# Patient Record
Sex: Male | Born: 1985 | Race: White | Hispanic: No | Marital: Single | Smoking: Former smoker
Health system: Southern US, Community
[De-identification: ages and names within clinical notes are randomized; demographics above are authoritative.]

## PROBLEM LIST (undated history)

## (undated) DIAGNOSIS — K219 Gastro-esophageal reflux disease without esophagitis: Secondary | ICD-10-CM

## (undated) DIAGNOSIS — M1990 Unspecified osteoarthritis, unspecified site: Secondary | ICD-10-CM

## (undated) DIAGNOSIS — F410 Panic disorder [episodic paroxysmal anxiety] without agoraphobia: Secondary | ICD-10-CM

## (undated) DIAGNOSIS — F419 Anxiety disorder, unspecified: Secondary | ICD-10-CM

## (undated) DIAGNOSIS — Z114 Encounter for screening for human immunodeficiency virus [HIV]: Secondary | ICD-10-CM

## (undated) HISTORY — DX: Unspecified osteoarthritis, unspecified site: M19.90

## (undated) HISTORY — DX: Gastro-esophageal reflux disease without esophagitis: K21.9

## (undated) HISTORY — DX: Encounter for screening for human immunodeficiency virus (HIV): Z11.4

## (undated) HISTORY — PX: EYE SURGERY: SHX253

---

## 2006-04-15 ENCOUNTER — Ambulatory Visit: Payer: Self-pay | Admitting: Pulmonary Disease

## 2007-02-24 ENCOUNTER — Ambulatory Visit: Payer: Self-pay | Admitting: Pulmonary Disease

## 2010-08-25 NOTE — Assessment & Plan Note (Signed)
[facility]                             PULMONARY OFFICE NOTE   Malik Wade, Malik Wade                     MRN:          [id number]  DATE:02/24/2007                            DOB:          May 17, 1985    HISTORY OF PRESENT ILLNESS:  The patient is a 25 year old white male  patient of Dr. Jodelle Green who was in his usual good state of health up  until 2 days ago when he started having nasal congestion, postnasal  drip, sore throat, hoarseness, and productive cough with thick mucus.  The patient denies any hemoptysis, orthopnea, PND, recent travel,  antibiotic use, or neck pain.   PAST MEDICAL HISTORY:  Reviewed.   CURRENT MEDICATIONS:  Reviewed.   PHYSICAL EXAMINATION:  GENERAL:  The patient is a pleasant male in no  acute distress.  VITAL SIGNS:  He is afebrile with stable vital signs.  O2 saturation is  99% on room air.  HEENT:  Nasal mucosa has some mild erythema.  Posterior pharynx is  clear.  NECK:  Supple without cervical adenopathy.  No JVD.  LUNGS:  Lung sounds are clear without wheezing or crackles.  CARDIAC:  Regular rate.  ABDOMEN:  Soft and nontender.  EXTREMITIES:  Warm without any edema.   IMPRESSION AND PLAN:  Acute upper respiratory infection suspected to be  viral in nature.  The patient is to begin Mucinex DM twice a day, Zyrtec  10 mg at bedtime as needed, Tylenol p.r.n., nasal hygiene regimen with  saline and Afrin nasal spray.  Discharge instruction sheet given.  The  patient is to return back with Dr. Kriste Basque as needed.  The patient was  given a Z-Pak to have on hold in case symptoms worsened after 1 week.      Rubye Oaks, NP  Electronically Signed      Lonzo Cloud. Kriste Basque, MD  Electronically Signed   TP/MedQ  DD: 02/24/2007  DT: 02/26/2007  Job #: [id number]

## 2011-07-13 ENCOUNTER — Telehealth: Payer: Self-pay | Admitting: Pulmonary Disease

## 2011-07-13 NOTE — Telephone Encounter (Signed)
Spoke with pt. He is c/o sharp pain in belly x 2 days. States that the pain comes and goes, but when occurs, the pain is severe. I advised should seek emergent care. Not seen here in over 3 yrs. Pt verbalized understanding and states nothing further needed.

## 2013-05-30 ENCOUNTER — Emergency Department (HOSPITAL_COMMUNITY)
Admission: EM | Admit: 2013-05-30 | Discharge: 2013-05-30 | Disposition: A | Discharge status: Home / Self Care | Payer: BC Managed Care – PPO | Attending: Emergency Medicine | Admitting: Emergency Medicine

## 2013-05-30 ENCOUNTER — Emergency Department (HOSPITAL_COMMUNITY): Payer: BC Managed Care – PPO

## 2013-05-30 ENCOUNTER — Encounter (HOSPITAL_COMMUNITY): Payer: Self-pay | Admitting: Emergency Medicine

## 2013-05-30 DIAGNOSIS — F411 Generalized anxiety disorder: Secondary | ICD-10-CM | POA: Insufficient documentation

## 2013-05-30 DIAGNOSIS — R0789 Other chest pain: Secondary | ICD-10-CM | POA: Insufficient documentation

## 2013-05-30 DIAGNOSIS — R630 Anorexia: Secondary | ICD-10-CM | POA: Insufficient documentation

## 2013-05-30 DIAGNOSIS — Z79899 Other long term (current) drug therapy: Secondary | ICD-10-CM | POA: Insufficient documentation

## 2013-05-30 DIAGNOSIS — F1410 Cocaine abuse, uncomplicated: Secondary | ICD-10-CM

## 2013-05-30 DIAGNOSIS — R1011 Right upper quadrant pain: Secondary | ICD-10-CM | POA: Insufficient documentation

## 2013-05-30 DIAGNOSIS — F17200 Nicotine dependence, unspecified, uncomplicated: Secondary | ICD-10-CM | POA: Insufficient documentation

## 2013-05-30 DIAGNOSIS — R109 Unspecified abdominal pain: Secondary | ICD-10-CM

## 2013-05-30 DIAGNOSIS — R110 Nausea: Secondary | ICD-10-CM | POA: Insufficient documentation

## 2013-05-30 DIAGNOSIS — F419 Anxiety disorder, unspecified: Secondary | ICD-10-CM

## 2013-05-30 DIAGNOSIS — R079 Chest pain, unspecified: Secondary | ICD-10-CM

## 2013-05-30 HISTORY — DX: Panic disorder (episodic paroxysmal anxiety): F41.0

## 2013-05-30 HISTORY — DX: Anxiety disorder, unspecified: F41.9

## 2013-05-30 LAB — CBC WITH DIFFERENTIAL/PLATELET
BASOS PCT: 0 % (ref 0–1)
Basophils Absolute: 0 10*3/uL (ref 0.0–0.1)
Eosinophils Absolute: 0.1 10*3/uL (ref 0.0–0.7)
Eosinophils Relative: 2 % (ref 0–5)
HEMATOCRIT: 43 % (ref 39.0–52.0)
Hemoglobin: 15.7 g/dL (ref 13.0–17.0)
Lymphocytes Relative: 49 % — ABNORMAL HIGH (ref 12–46)
Lymphs Abs: 3.9 10*3/uL (ref 0.7–4.0)
MCH: 30.9 pg (ref 26.0–34.0)
MCHC: 36.5 g/dL — ABNORMAL HIGH (ref 30.0–36.0)
MCV: 84.6 fL (ref 78.0–100.0)
Monocytes Absolute: 0.7 10*3/uL (ref 0.1–1.0)
Monocytes Relative: 9 % (ref 3–12)
NEUTROS ABS: 3.2 10*3/uL (ref 1.7–7.7)
NEUTROS PCT: 40 % — AB (ref 43–77)
Platelets: 200 10*3/uL (ref 150–400)
RBC: 5.08 MIL/uL (ref 4.22–5.81)
RDW: 11.9 % (ref 11.5–15.5)
WBC: 8 10*3/uL (ref 4.0–10.5)

## 2013-05-30 LAB — URINALYSIS, ROUTINE W REFLEX MICROSCOPIC
Bilirubin Urine: NEGATIVE
GLUCOSE, UA: NEGATIVE mg/dL
Hgb urine dipstick: NEGATIVE
KETONES UR: NEGATIVE mg/dL
Leukocytes, UA: NEGATIVE
Nitrite: NEGATIVE
PROTEIN: NEGATIVE mg/dL
Specific Gravity, Urine: 1.03 (ref 1.005–1.030)
Urobilinogen, UA: 0.2 mg/dL (ref 0.0–1.0)
pH: 5.5 (ref 5.0–8.0)

## 2013-05-30 LAB — COMPREHENSIVE METABOLIC PANEL
ALBUMIN: 4.7 g/dL (ref 3.5–5.2)
ALK PHOS: 66 U/L (ref 39–117)
ALT: 20 U/L (ref 0–53)
AST: 18 U/L (ref 0–37)
BILIRUBIN TOTAL: 0.7 mg/dL (ref 0.3–1.2)
BUN: 15 mg/dL (ref 6–23)
CHLORIDE: 101 meq/L (ref 96–112)
CO2: 23 meq/L (ref 19–32)
Calcium: 10 mg/dL (ref 8.4–10.5)
Creatinine, Ser: 0.93 mg/dL (ref 0.50–1.35)
GFR calc Af Amer: >90 mL/min (ref >90)
Glucose, Bld: 109 mg/dL — ABNORMAL HIGH (ref 70–99)
POTASSIUM: 3.6 meq/L — AB (ref 3.7–5.3)
Sodium: 140 mEq/L (ref 137–147)
Total Protein: 7.4 g/dL (ref 6.0–8.3)

## 2013-05-30 LAB — RAPID URINE DRUG SCREEN, HOSP PERFORMED
Amphetamines: NOT DETECTED
Barbiturates: NOT DETECTED
Benzodiazepines: NOT DETECTED
Cocaine: POSITIVE — AB
Opiates: NOT DETECTED
Tetrahydrocannabinol: NOT DETECTED

## 2013-05-30 LAB — TROPONIN I

## 2013-05-30 LAB — LIPASE, BLOOD: LIPASE: 25 U/L (ref 11–59)

## 2013-05-30 NOTE — Progress Notes (Signed)
   CARE MANAGEMENT ED NOTE 05/30/2013  Patient:  Malik Wade,Malik Wade   Account Number:  [id number]  Date Initiated:  05/30/2013  Documentation initiated by:  Edd ArbourGIBBS,KIMBERLY  Subjective/Objective Assessment:   28 yr old male bcbs [place] ppo no pcp listed     Subjective/Objective Assessment Detail:   pcp per pt is pa [facility] at [facility]     Action/Plan:   cm spoke with pt updated epic   Action/Plan Detail:   Anticipated DC Date:       Status Recommendation to Physician:   Result of Recommendation:    Other ED Services  Consult Working Plan    DC Planning Services  Other  PCP issues    Choice offered to / List presented to:            Status of service:  Completed, signed off  ED Comments:   ED Comments Detail:

## 2013-05-30 NOTE — ED Provider Notes (Signed)
CSN: [id number]     Arrival date & time 05/30/13  4098 History   First MD Initiated Contact with Patient 05/30/13 [id number]     Chief Complaint  Patient presents with  . Anxiety  . Abdominal Pain    HPI  Malik Wade is a 28 y.o. male with a PMH of anxiety and panic attacks who presents to the ED for evaluation of anxiety and abdominal pain.  History was provided by the patient.  Patient states he has had intermittent RUQ abdominal pain for the past 2 years. His pain is located in his RUQ without radiation and is described as a stabbing pain. Drinking alcohol and eating fatty foods exacerbate his pain. He also has had nausea with no emesis. He states he had a drinking binge two weeks ago with severe RUQ pain, generalized abdominal bloating, and constipation for 2 days. His constipation and bloating resolved and he has had normal bowel movements since. His RUQ pain continues intermittently as a sharp pain which lasts a few seconds several times a day. He has not done anything to treat his symptoms. No similar pain in the past. No hx of abdominal surgeries. No fevers.   Patient also complains of intermittent episodes of chest pain for years with no acute changes. He states he gets a sharp "twinge" of pain which lasts a few seconds in the left side of his chest without radiation. He denies any chest pain currently. His last episodes of chest pain was 2-3 hours PTA. He states he also has been having anxiety and panic attacks. He went off of his lexapro a few months ago because it "wasn't working." He states he has been having trouble sleeping. No lightheadedness, dizziness, SOB, cough, dyspnea. He admits to using cocaine two days ago. No other drug use. Patient is a current tobacco user. No significant FH hx of cardiac disease. No SI.    Past Medical History  Diagnosis Date  . Anxiety   . Panic attack    History reviewed. No pertinent past surgical history. No family history on file. History   Substance Use Topics  . Smoking status: Current Every Day Smoker  . Smokeless tobacco: Not on file  . Alcohol Use: Yes    Review of Systems  Constitutional: Positive for appetite change. Negative for fever, chills, diaphoresis, activity change and fatigue.  HENT: Negative for congestion, rhinorrhea and sore throat.   Respiratory: Negative for cough, shortness of breath and wheezing.   Cardiovascular: Positive for chest pain. Negative for leg swelling.  Gastrointestinal: Positive for nausea, abdominal pain and constipation (resolved). Negative for vomiting, diarrhea, blood in stool and anal bleeding.  Genitourinary: Negative for dysuria and difficulty urinating.  Musculoskeletal: Negative for back pain, myalgias and neck pain.  Skin: Negative for rash.  Neurological: Negative for dizziness, syncope, weakness, light-headedness and headaches.  Psychiatric/Behavioral: Positive for sleep disturbance. The patient is nervous/anxious.     Allergies  Review of patient's allergies indicates no known allergies.  Home Medications   Current Outpatient Rx  Name  Route  Sig  Dispense  Refill  . amphetamine-dextroamphetamine (ADDERALL) 20 MG tablet   Oral   Take 20 mg by mouth daily.         . diphenhydramine-acetaminophen (TYLENOL PM) 25-500 MG TABS   Oral   Take 2 tablets by mouth at bedtime as needed.         Marland Kitchen ibuprofen (ADVIL,MOTRIN) 200 MG tablet   Oral   Take 600 mg  by mouth every 6 (six) hours as needed for mild pain.          BP 157/96  Pulse 102  Temp(Src) 97.8 F (36.6 C) (Oral)  Resp 14  SpO2 100%  Filed Vitals:   05/30/13 0857 05/30/13 1217  BP: 157/96 121/69  Pulse: 102 66  Temp: 97.8 F (36.6 C) 98.1 F (36.7 C)  TempSrc: Oral Oral  Resp: 14 16  SpO2: 100% 96%    Physical Exam  Nursing note and vitals reviewed. Constitutional: He is oriented to person, place, and time. He appears well-developed and well-nourished. No distress.  HENT:  Head:  Normocephalic and atraumatic.  Right Ear: External ear normal.  Left Ear: External ear normal.  Nose: Nose normal.  Mouth/Throat: Oropharynx is clear and moist. No oropharyngeal exudate.  Eyes: Conjunctivae are normal. Right eye exhibits no discharge. Left eye exhibits no discharge.  Neck: Normal range of motion. Neck supple.  Cardiovascular: Normal rate, regular rhythm and normal heart sounds.  Exam reveals no gallop and no friction rub.   No murmur heard. Pulmonary/Chest: Effort normal and breath sounds normal. No respiratory distress. He has no wheezes. He has no rales. He exhibits no tenderness.  Abdominal: Soft. Bowel sounds are normal. He exhibits no distension and no mass. There is no tenderness. There is no rebound and no guarding.  Musculoskeletal: Normal range of motion. He exhibits no edema and no tenderness.  No LE edema or calf tenderness bilaterally. No flank, CVA or lumbar tenderness bilaterally  Neurological: He is alert and oriented to person, place, and time.  Skin: Skin is warm and dry. He is not diaphoretic.    ED Course  Procedures (including critical care time) Labs Review Labs Reviewed - No data to display Imaging Review No results found.  EKG Interpretation    Date/Time:  Wednesday May 30 2013 10:29:25 EST Ventricular Rate:  85 PR Interval:  137 QRS Duration: 100 QT Interval:  371 QTC Calculation: 441 R Axis:   64 Text Interpretation:  Sinus rhythm Baseline wander in lead(s) V5 Sinus rhythm Artifact T wave abnormality Abnormal ekg Confirmed by Gerhard Munch  MD [phone]) on 05/30/2013 11:09:26 AM           Results for orders placed during the hospital encounter of 05/30/13  TROPONIN I      Result Value Ref Range   Troponin I <0.30  <0.30 ng/mL  CBC WITH DIFFERENTIAL      Result Value Ref Range   WBC 8.0  4.0 - 10.5 K/uL   RBC 5.08  4.22 - 5.81 MIL/uL   Hemoglobin 15.7  13.0 - 17.0 g/dL   HCT 96.0  45.4 - 09.8 %   MCV 84.6  78.0 - 100.0  fL   MCH 30.9  26.0 - 34.0 pg   MCHC 36.5 (*) 30.0 - 36.0 g/dL   RDW 11.9  14.7 - 82.9 %   Platelets 200  150 - 400 K/uL   Neutrophils Relative % 40 (*) 43 - 77 %   Neutro Abs 3.2  1.7 - 7.7 K/uL   Lymphocytes Relative 49 (*) 12 - 46 %   Lymphs Abs 3.9  0.7 - 4.0 K/uL   Monocytes Relative 9  3 - 12 %   Monocytes Absolute 0.7  0.1 - 1.0 K/uL   Eosinophils Relative 2  0 - 5 %   Eosinophils Absolute 0.1  0.0 - 0.7 K/uL   Basophils Relative 0  0 - 1 %  Basophils Absolute 0.0  0.0 - 0.1 K/uL  COMPREHENSIVE METABOLIC PANEL      Result Value Ref Range   Sodium 140  137 - 147 mEq/L   Potassium 3.6 (*) 3.7 - 5.3 mEq/L   Chloride 101  96 - 112 mEq/L   CO2 23  19 - 32 mEq/L   Glucose, Bld 109 (*) 70 - 99 mg/dL   BUN 15  6 - 23 mg/dL   Creatinine, Ser 1.610.93  0.50 - 1.35 mg/dL   Calcium 09.610.0  8.4 - 04.510.5 mg/dL   Total Protein 7.4  6.0 - 8.3 g/dL   Albumin 4.7  3.5 - 5.2 g/dL   AST 18  0 - 37 U/L   ALT 20  0 - 53 U/L   Alkaline Phosphatase 66  39 - 117 U/L   Total Bilirubin 0.7  0.3 - 1.2 mg/dL   GFR calc non Af Amer >90  >90 mL/min   GFR calc Af Amer >90  >90 mL/min  LIPASE, BLOOD      Result Value Ref Range   Lipase 25  11 - 59 U/L  URINALYSIS, ROUTINE W REFLEX MICROSCOPIC      Result Value Ref Range   Color, Urine YELLOW  YELLOW   APPearance CLEAR  CLEAR   Specific Gravity, Urine 1.030  1.005 - 1.030   pH 5.5  5.0 - 8.0   Glucose, UA NEGATIVE  NEGATIVE mg/dL   Hgb urine dipstick NEGATIVE  NEGATIVE   Bilirubin Urine NEGATIVE  NEGATIVE   Ketones, ur NEGATIVE  NEGATIVE mg/dL   Protein, ur NEGATIVE  NEGATIVE mg/dL   Urobilinogen, UA 0.2  0.0 - 1.0 mg/dL   Nitrite NEGATIVE  NEGATIVE   Leukocytes, UA NEGATIVE  NEGATIVE  URINE RAPID DRUG SCREEN (HOSP PERFORMED)      Result Value Ref Range   Opiates NONE DETECTED  NONE DETECTED   Cocaine POSITIVE (*) NONE DETECTED   Benzodiazepines NONE DETECTED  NONE DETECTED   Amphetamines NONE DETECTED  NONE DETECTED   Tetrahydrocannabinol NONE  DETECTED  NONE DETECTED   Barbiturates NONE DETECTED  NONE DETECTED      DG Chest 2 View (Final result)  Result time: 05/30/13 10:00:50    Final result by Rad Results In Interface (05/30/13 10:00:50)    Narrative:   CLINICAL DATA: Chest pain A  EXAM: CHEST 2 VIEW  COMPARISON: None.  FINDINGS: Normal heart size and mediastinal contours. No acute infiltrate or edema. No effusion or pneumothorax. No acute osseous findings.  IMPRESSION: No active cardiopulmonary disease.   Electronically Signed By: Tiburcio PeaJonathan Watts M.D. On: 05/30/2013 10:00                US Abdomen Complete (Final result)  Result time: 05/30/13 12:09:00    Final result by Rad Results In Interface (05/30/13 12:09:00)    Narrative:   CLINICAL DATA: Right upper quadrant pain.  EXAM: ULTRASOUND ABDOMEN COMPLETE  COMPARISON: None.  FINDINGS: Gallbladder:  No gallstones or wall thickening visualized. No sonographic Murphy sign noted.  Common bile duct:  Diameter: 5 mm. No evidence of filling defect.  Liver:  No focal lesion identified. Within normal limits in parenchymal echogenicity. Antegrade flow in the imaged portal venous system.  IVC:  No abnormality visualized.  Pancreas:  Limited visualization due to tight sonographic windows  Spleen:  Size and appearance within normal limits.  Right Kidney:  Length: 11 cm. Echogenicity within normal limits. No mass or hydronephrosis visualized.  Left  Kidney:  Length: 12 cm. Echogenicity within normal limits. No mass or hydronephrosis visualized.  Abdominal aorta:  Only the proximal aorta is visible, nondilated at 1.8 cm.  IMPRESSION: Negative abdominal ultrasound.   Electronically Signed By: Tiburcio Pea M.D. On: 05/30/2013 12:09    MDM   Malik Wade is a 28 y.o. male with a PMH of anxiety and panic attacks who presents to the ED for evaluation of anxiety and abdominal pain.  Rechecks  12:30 PM = Discussed  results and follow-up with patient.    Patient evaluated for multiple chronic complaints including abdominal and chest pain. EKG negative for any acute ischemic changes. Troponin negative. Chest x-ray negative for an acute cardiopulmonary process. Chest may be related to anxiety. Cocaine positive with last use two days ago. Patient had no chest pain throughout his ED visit. Patient also complained of abdominal pain. Labs unremarkable. Abdominal exam benign. [place] negative for biliary changes or other acute abdominal abnormality. Patient afebrile and non-toxic in appearance. Patient encouraged to follow-up with PCP. Return precautions, discharge instructions, and follow-up was discussed with the patient before discharge.     Discharge Medication List as of 05/30/2013 12:42 PM      Final impressions: 1. Anxiety   2. Abdominal pain   3. Chest pain   4. Cocaine abuse       Luiz Iron PA-C   This patient was discussed with Dr. Vinetta Bergamo, PA-C 05/30/13 1906

## 2013-05-30 NOTE — Discharge Instructions (Signed)
Return to the emergency department if you develop any changing/worsening condition, chest pain, abdominal pain, repeated vomiting, fever, blood in your stool/vomit, or any other concerns (please read additional information regarding your condition below) Please read below    Generalized Anxiety Disorder Generalized anxiety disorder (GAD) is a mental disorder. It interferes with life functions, including relationships, work, and school. GAD is different from normal anxiety, which everyone experiences at some point in their lives in response to specific life events and activities. Normal anxiety actually helps us prepare for and get through these life events and activities. Normal anxiety goes away after the event or activity is over.  GAD causes anxiety that is not necessarily related to specific events or activities. It also causes excess anxiety in proportion to specific events or activities. The anxiety associated with GAD is also difficult to control. GAD can vary from mild to severe. People with severe GAD can have intense waves of anxiety with physical symptoms (panic attacks).  SYMPTOMS The anxiety and worry associated with GAD are difficult to control. This anxiety and worry are related to many life events and activities and also occur more days than not for 6 months or longer. People with GAD also have three or more of the following symptoms (one or more in children):  Restlessness.   Fatigue.  Difficulty concentrating.   Irritability.  Muscle tension.  Difficulty sleeping or unsatisfying sleep. DIAGNOSIS GAD is diagnosed through an assessment by your caregiver. Your caregiver will ask you questions aboutyour mood,physical symptoms, and events in your life. Your caregiver may ask you about your medical history and use of alcohol or drugs, including prescription medications. Your caregiver may also do a physical exam and blood tests. Certain medical conditions and the use of certain  substances can cause symptoms similar to those associated with GAD. Your caregiver may refer you to a mental health specialist for further evaluation. TREATMENT The following therapies are usually used to treat GAD:   Medication Antidepressant medication usually is prescribed for long-term daily control. Antianxiety medications may be added in severe cases, especially when panic attacks occur.   Talk therapy (psychotherapy) Certain types of talk therapy can be helpful in treating GAD by providing support, education, and guidance. A form of talk therapy called cognitive behavioral therapy can teach you healthy ways to think about and react to daily life events and activities.  Stress managementtechniques These include yoga, meditation, and exercise and can be very helpful when they are practiced regularly. A mental health specialist can help determine which treatment is best for you. Some people see improvement with one therapy. However, other people require a combination of therapies. Document Released: 07/24/2012 Document Reviewed: 07/24/2012 [facility] Patient Information 2014 [place].  Insomnia Insomnia is frequent trouble falling and/or staying asleep. Insomnia can be a long term problem or a short term problem. Both are common. Insomnia can be a short term problem when the wakefulness is related to a certain stress or worry. Long term insomnia is often related to ongoing stress during waking hours and/or poor sleeping habits. Overtime, sleep deprivation itself can make the problem worse. Every little thing feels more severe because you are overtired and your ability to cope is decreased. CAUSES   Stress, anxiety, and depression.  Poor sleeping habits.  Distractions such as TV in the bedroom.  Naps close to bedtime.  Engaging in emotionally charged conversations before bed.  Technical reading before sleep.  Alcohol and other sedatives. They may make the problem  worse. They  can hurt normal sleep patterns and normal dream activity.  Stimulants such as caffeine for several hours prior to bedtime.  Pain syndromes and shortness of breath can cause insomnia.  Exercise late at night.  Changing time zones may cause sleeping problems (jet lag). It is sometimes helpful to have someone observe your sleeping patterns. They should look for periods of not breathing during the night (sleep apnea). They should also look to see how long those periods last. If you live alone or observers are uncertain, you can also be observed at a sleep clinic where your sleep patterns will be professionally monitored. Sleep apnea requires a checkup and treatment. Give your caregivers your medical history. Give your caregivers observations your family has made about your sleep.  SYMPTOMS   Not feeling rested in the morning.  Anxiety and restlessness at bedtime.  Difficulty falling and staying asleep. TREATMENT   Your caregiver may prescribe treatment for an underlying medical disorders. Your caregiver can give advice or help if you are using alcohol or other drugs for self-medication. Treatment of underlying problems will usually eliminate insomnia problems.  Medications can be prescribed for short time use. They are generally not recommended for lengthy use.  Over-the-counter sleep medicines are not recommended for lengthy use. They can be habit forming.  You can promote easier sleeping by making lifestyle changes such as:  Using relaxation techniques that help with breathing and reduce muscle tension.  Exercising earlier in the day.  Changing your diet and the time of your last meal. No night time snacks.  Establish a regular time to go to bed.  Counseling can help with stressful problems and worry.  Soothing music and white noise may be helpful if there are background noises you cannot remove.  Stop tedious detailed work at least one hour before bedtime. HOME CARE  INSTRUCTIONS   Keep a diary. Inform your caregiver about your progress. This includes any medication side effects. See your caregiver regularly. Take note of:  Times when you are asleep.  Times when you are awake during the night.  The quality of your sleep.  How you feel the next day. This information will help your caregiver care for you.  Get out of bed if you are still awake after 15 minutes. Read or do some quiet activity. Keep the lights down. Wait until you feel sleepy and go back to bed.  Keep regular sleeping and waking hours. Avoid naps.  Exercise regularly.  Avoid distractions at bedtime. Distractions include watching television or engaging in any intense or detailed activity like attempting to balance the household checkbook.  Develop a bedtime ritual. Keep a familiar routine of bathing, brushing your teeth, climbing into bed at the same time each night, listening to soothing music. Routines increase the success of falling to sleep faster.  Use relaxation techniques. This can be using breathing and muscle tension release routines. It can also include visualizing peaceful scenes. You can also help control troubling or intruding thoughts by keeping your mind occupied with boring or repetitive thoughts like the old concept of counting sheep. You can make it more creative like imagining planting one beautiful flower after another in your backyard garden.  During your day, work to eliminate stress. When this is not possible use some of the previous suggestions to help reduce the anxiety that accompanies stressful situations. MAKE SURE YOU:   Understand these instructions.  Will watch your condition.  Will get help right away if you  are not doing well or get worse. Document Released: 03/26/2000 Document Revised: 06/21/2011 Document Reviewed: 04/26/2007 [facility] Patient Information 2014 [place], [place].  Abdominal Pain, Adult Many things can cause abdominal pain. Usually,  abdominal pain is not caused by a disease and will improve without treatment. It can often be observed and treated at home. Your health care provider will do a physical exam and possibly order blood tests and X-rays to help determine the seriousness of your pain. However, in many cases, more time must pass before a clear cause of the pain can be found. Before that point, your health care provider may not know if you need more testing or further treatment. HOME CARE INSTRUCTIONS  Monitor your abdominal pain for any changes. The following actions may help to alleviate any discomfort you are experiencing:  Only take over-the-counter or prescription medicines as directed by your health care provider.  Do not take laxatives unless directed to do so by your health care provider.  Try a clear liquid diet (broth, tea, or water) as directed by your health care provider. Slowly move to a bland diet as tolerated. SEEK MEDICAL CARE IF:  You have unexplained abdominal pain.  You have abdominal pain associated with nausea or diarrhea.  You have pain when you urinate or have a bowel movement.  You experience abdominal pain that wakes you in the night.  You have abdominal pain that is worsened or improved by eating food.  You have abdominal pain that is worsened with eating fatty foods. SEEK IMMEDIATE MEDICAL CARE IF:   Your pain does not go away within 2 hours.  You have a fever.  You keep throwing up (vomiting).  Your pain is felt only in portions of the abdomen, such as the right side or the left lower portion of the abdomen.  You pass bloody or black tarry stools. MAKE SURE YOU:  Understand these instructions.   Will watch your condition.   Will get help right away if you are not doing well or get worse.  Document Released: 01/06/2005 Document Revised: 01/17/2013 Document Reviewed: 12/06/2012 [facility] Patient Information 2014 [place], [place].  Chest Pain (Nonspecific) It is often  hard to give a specific diagnosis for the cause of chest pain. There is always a chance that your pain could be related to something serious, such as a heart attack or a blood clot in the lungs. You need to follow up with your caregiver for further evaluation. CAUSES   Heartburn.  Pneumonia or bronchitis.  Anxiety or stress.  Inflammation around your heart (pericarditis) or lung (pleuritis or pleurisy).  A blood clot in the lung.  A collapsed lung (pneumothorax). It can develop suddenly on its own (spontaneous pneumothorax) or from injury (trauma) to the chest.  Shingles infection (herpes zoster virus). The chest wall is composed of bones, muscles, and cartilage. Any of these can be the source of the pain.  The bones can be bruised by injury.  The muscles or cartilage can be strained by coughing or overwork.  The cartilage can be affected by inflammation and become sore (costochondritis). DIAGNOSIS  Lab tests or other studies, such as X-rays, electrocardiography, stress testing, or cardiac imaging, may be needed to find the cause of your pain.  TREATMENT   Treatment depends on what may be causing your chest pain. Treatment may include:  Acid blockers for heartburn.  Anti-inflammatory medicine.  Pain medicine for inflammatory conditions.  Antibiotics if an infection is present.  You may be advised  to change lifestyle habits. This includes stopping smoking and avoiding alcohol, caffeine, and chocolate.  You may be advised to keep your head raised (elevated) when sleeping. This reduces the chance of acid going backward from your stomach into your esophagus.  Most of the time, nonspecific chest pain will improve within 2 to 3 days with rest and mild pain medicine. HOME CARE INSTRUCTIONS   If antibiotics were prescribed, take your antibiotics as directed. Finish them even if you start to feel better.  For the next few days, avoid physical activities that bring on chest pain.  Continue physical activities as directed.  Do not smoke.  Avoid drinking alcohol.  Only take over-the-counter or prescription medicine for pain, discomfort, or fever as directed by your caregiver.  Follow your caregiver's suggestions for further testing if your chest pain does not go away.  Keep any follow-up appointments you made. If you do not go to an appointment, you could develop lasting (chronic) problems with pain. If there is any problem keeping an appointment, you must call to reschedule. SEEK MEDICAL CARE IF:   You think you are having problems from the medicine you are taking. Read your medicine instructions carefully.  Your chest pain does not go away, even after treatment.  You develop a rash with blisters on your chest. SEEK IMMEDIATE MEDICAL CARE IF:   You have increased chest pain or pain that spreads to your arm, neck, jaw, back, or abdomen.  You develop shortness of breath, an increasing cough, or you are coughing up blood.  You have severe back or abdominal pain, feel nauseous, or vomit.  You develop severe weakness, fainting, or chills.  You have a fever. THIS IS AN EMERGENCY. Do not wait to see if the pain will go away. Get medical help at once. Call your local emergency services (911 in [place] Do not drive yourself to the hospital. MAKE SURE YOU:   Understand these instructions.  Will watch your condition.  Will get help right away if you are not doing well or get worse. Document Released: 01/06/2005 Document Revised: 06/21/2011 Document Reviewed: 11/02/2007 [facility] Patient Information 2014 [place], [place].  Cocaine Cocaine stimulates the central nervous system. As a stimulant, cocaine has the ability to improve athletic performance through increasing speed, endurance, and concentration, as well as decreasing fatigue. Although cocaine may seem to be beneficial for athletics, it is highly addicting and has many debilitating side effects. Cocaine has  caused the deaths of many athletes, and its use is banned by every major athletic organization in the world. The clinical effect of cocaine (the high) is very short in duration. Cocaine works in the brain by altering the normal concentrations of chemicals that stimulate the brain cells.  WHY ATHLETES USE IT  Many athletes use cocaine for its central nervous system stimulating properties. It is also used as a recreational drug due to the euphoric felling it produces.  ADVERSE EFFECTS   Sleep disturbances.  Abnormal heart rhythms.  Stroke.  Heart attack.  Seizures.  Elevated blood pressure.  Death.  Paranoia (feeling that people want to hurt you).  Panic attacks (sudden feelings of anxiety or shortness of breath).  Suicidal behavior (wanting to kill yourself).  Homicidal behavior (wanting to kill other people).  Depression (feeling very sad, having decreased energy for activities).  Poor athletic performance. PHARMACOLOGY  Cocaine acts on the body for a short period of time; the clinical effects may last less than1 hour. Since most athletic competitions last for  more than 1 hour, cocaine use may not improve athletic performance. The use of cocaine makes individuals much more susceptible for serious conditions such as seizures, arrhythmia (irregular heart beat), and strokes. Even a single dose of cocaine can be detected on a drug test for up to about 30 hours.  PREVENTION Most athletes use cocaine as a recreational drug and not for the purpose of enhancing athletic performance. To prevent the use of cocaine, athletes must be educated on its side effects and the risk of addiction. If an athlete is found using cocaine, counseling and treatment are almost always required.  Document Released: 03/29/2005 Document Revised: 06/21/2011 Document Reviewed: 07/11/2008 [facility] Patient Information 2014 [place], [place].  Emergency Department Resource Guide 1) Find a Doctor and Pay Out of  Pocket Although you won't have to find out who is covered by your insurance plan, it is a good idea to ask around and get recommendations. You will then need to call the office and see if the doctor you have chosen will accept you as a new patient and what types of options they offer for patients who are self-pay. Some doctors offer discounts or will set up payment plans for their patients who do not have insurance, but you will need to ask so you aren't surprised when you get to your appointment.  2) Contact Your Local Health Department Not all health departments have doctors that can see patients for sick visits, but many do, so it is worth a call to see if yours does. If you don't know where your local health department is, you can check in your phone book. The CDC also has a tool to help you locate your state's health department, and many state websites also have listings of all of their local health departments.  3) Find a Walk-in Clinic If your illness is not likely to be very severe or complicated, you may want to try a walk in clinic. These are popping up all over the country in pharmacies, drugstores, and shopping centers. They're usually staffed by nurse practitioners or physician assistants that have been trained to treat common illnesses and complaints. They're usually fairly quick and inexpensive. However, if you have serious medical issues or chronic medical problems, these are probably not your best option.  No Primary Care Doctor: - Call Health Connect at  [phone] - they can help you locate a primary care doctor that  accepts your insurance, provides certain services, etc. - Physician Referral Service- [phone]  Chronic Pain Problems: Organization         Address  Phone   Notes  [facility]  [phone] Patients need to be referred by their primary care doctor.   Medication Assistance: Organization         Address  Phone   Notes  [facility]  Medication [facility] [address] [place] [place], [place] [address] [phone] --Must be a resident of [facility] -- Must have NO insurance coverage whatsoever (no Medicaid/ Medicare, etc.) -- The pt. MUST have a primary care doctor that directs their care regularly and follows them in the community   [facility]  [phone]   [employer]  [phone]    Agencies that provide inexpensive medical care: Organization         Address  Phone   Notes  [facility]  [phone]   [facility]    [phone]   [facility] [address] [place], [place] [address] [phone]   [facility] [address] [place], [place] [phone]   [facility]anned Parenthood    [phone]   [facility]    [phone]   [facility]  [address], [place] Phone:  [phone], Fax:  [phone] Hours of Operation:  9 am - 6 pm, M-F.  Also accepts Medicaid/Medicare and self-pay.  [facility]  [address], [place] Phone: [phone], Fax: [phone]. Hours of Operation:  8:30 am - 5:30 pm, M-F.  Also accepts Medicaid and self-pay.  [facility] [place] [address] [place] [address] [place] Phone: [phone]   [facility] [address] Natasha Bence [place], [place] [phone], Ext. [phone] Mondays & Thursdays: 7-9 AM.  First 15 patients are seen on a first come, first serve basis.    Medicaid-accepting [facility] Providers:  Organization         Address  Phone   Notes  [facility] [address], [place] [phone] Also accepts self-pay patients.  [facility] [address] Laurell Josephs [place], [place]  [phone]   [facility] [address], [place] [phone]   [facility] [address] [place] [phone]   Renaye Rakers [address], [place]   [phone] Only accepts [place] patients after they have their name applied to their card.   Self-Pay (no insurance) in [facility]:  Organization         Address  Phone   Notes  Sickle Cell Patients, [facility] [address] [place], [place] [phone]   [facility] [address] [place], [place] [phone]   [facility]  [address], [place] [phone]   [facility]/Dr. Osei-Bonsu  [address] [place] or [address], [place] [phone] Phone number for both [place] and [place] locations is the same.  [facility] [address] [place] [phone]   [facility] [address] [place] or [address] [phone]   [facility] [address] [place] [phone], phone; [phone], fax Sees patients 1st and 3rd Saturday of every month.  Must not qualify for public or private insurance (i.e. Medicaid, Medicare, [place] Health Choice, Veterans' Benefits)  Household income should be no more than 200% of the poverty level The clinic cannot treat you if you are pregnant or think you are pregnant  Sexually transmitted diseases are not treated at the clinic.    Dental Care: Organization         Address  Phone  Notes  [facility] Department of [facility] [address] [place] at [place], [place] [phone] Accepts children up to age 67 who are enrolled in [place] or [place] Health Choice; pregnant women with a Medicaid card; and children who have applied for Medicaid or [place] Health Choice, but were declined, whose parents can pay a reduced fee at time of service.  [facility] Department of [facility]  [address] [place] [address] [place]  [phone] Accepts children up to age 87 who are enrolled  in Medicaid or [place] Health Choice; pregnant women with a Medicaid card; and children who have applied for Medicaid or [place] Health Choice, but were declined, whose parents can pay a reduced fee at time of service.  [facility]  [address] [place], [place] [phone] Patients are seen by appointment only. Walk-ins are not accepted. [facility] will see patients 46 years of age and older. Monday - Tuesday (8am-5pm) Most Wednesdays (8:30-5pm) $30 per visit, cash only  [facility]  [address], [facility] [phone] Patients are seen by appointment only. Walk-ins are not accepted. [facility] will see patients 38 years of age and older. One Wednesday Evening (Monthly: Volunteer Based).  $30 per visit, cash only  [employer]  [phone] for adults; Children under age 76, call Graduate Pediatric Dentistry at [phone]. Children aged 30-14, please call [phone] to request a pediatric application.  Dental services are provided in all areas of dental care including fillings, crowns and bridges, complete and partial dentures, implants, gum treatment, root canals, and extractions. Preventive care is also provided. Treatment is provided to both adults and children. Patients are selected via a lottery and there is often a waiting list.   [facility] [address], [place]  [phone] [email or web address]   [facility] [address] [place], [place] [phone], Ext. [phone] Second and Fourth Thursday of each month, opens at 6:30 AM; Clinic ends at 9 AM.  Patients are seen on a first-come first-served basis, and a limited number are seen during each clinic.   [facility]  [address] Ether Griffins [place], [place] [phone]   Eligibility Requirements You must have lived in [place], [place], or [place]  counties for at least the last three months.   You cannot be eligible for state or federal sponsored [place], including [employer], [place], or [employer].   You generally cannot be eligible for healthcare insurance through your employer.    How to apply: Eligibility screenings are held every Tuesday and Wednesday afternoon from 1:00 pm until 4:00 pm. You do not need an appointment for the interview!  [facility] [address] [place], [place] [phone]   [facility] Health Department  [phone]   [facility] Health Department  [phone]   [facility] Health Department  [phone]    Behavioral Health Resources in the Community: Intensive Outpatient Programs Organization         Address  Phone  Notes  [facility] Services [address] [place], [place] [phone]   [facility] Outpatient [address], [place], [place] [phone]   [facility] [address], [place], [place]  [phone]   [facility] [address]  [place], [place] [phone] or [phone]   Substance Abuse Resources Organization         Address  Phone  Notes  [facility]  [phone]   [facility]  [phone]   [facility] [place]  [phone]   Floydene Flock  [phone]   Residential & Outpatient Substance Abuse Program  [phone]   Psychological Services Organization         Address  Phone  Notes  [facility] Health  [phone]   [facility] Services  [phone]   [facility] [address], [place] [phone] or  [phone]    Mobile Crisis Teams Organization         Address  Phone  Notes  [facility], Mobile Crisis Care Unit  [phone]   [facility]  [address] [place], [place] [phone]   [facility] [address] [place]  [place] [phone]    Self-Help/Support Groups Organization         Address  Phone             Notes  [facility] - variety of support groups  [phone]- [phone] Call for more information  Narcotics Anonymous (NA), [facility] [address], [employer] [place]  2 meetings at this location   Statistician         Address  Phone  Notes  [facility] [address],    [place]  [phone]   [facility]  [address], [place] [address], [place], [place] [phone]   [facility] [address] [place], [place] [phone] Admissions: 8am-3pm M-F  [facility] [address],    [place], [place] [phone]   [facility] [address] [place], [place], [place] [phone]   [facility] [address]  [place], [place] [phone]   [facility] - Intensive Outpatient [address], [place], [place] [phone]   [facility] ([facility] [address] [place]  [place], [place] [phone] or [phone]   [facility] ([facility]) [address] [place], [place] [phone] Accepts Medicaid  [facility] [address],  [place] [phone] Substance Abuse/Addiction Treatment   [facility] Organization         Address  Phone  Notes  [facility]  [phone]   Angie Fava, PhD [address] [place], [place]   [phone] or [phone]   [facility] Behavioral   [address] [place], [place] [phone]   [facility] [address] [place], [place] [phone] Insurance/Medicaid/sponsorship through [facility] [address]                                    [place], [place] [phone] Therapy/tele-psych/case  [facility] [address], [place] [phone]    Dr. Lolly Mustache  [phone]   [facility]  [facility] Dept. 1) [address], [place] 2) [address], [place] 3)  [address], [place] [phone]  [phone]   [facility] Child Abuse Hotline 804-434-9688 or [phone] (After Hours)

## 2013-05-30 NOTE — ED Notes (Signed)
MD at bedside. 

## 2013-05-30 NOTE — ED Notes (Signed)
Pt reports stomach pain stat results usually after drinking. Last episode 2 weeks ago. Worst was 2 weeks ago. Denies N/V/D and fever. Pt c/o of slight pain to RUQ. Pt here really for anxiety and panic attack he had on Tuesday. Insomnia. Pt also admits to use of cocaine on Tuesday night.

## 2013-05-31 NOTE — ED Provider Notes (Signed)
  Medical screening examination/treatment/procedure(s) were performed by non-physician practitioner and as supervising physician I was immediately available for consultation/collaboration.  EKG Interpretation    Date/Time:  Wednesday May 30 2013 10:29:25 EST Ventricular Rate:  85 PR Interval:  137 QRS Duration: 100 QT Interval:  371 QTC Calculation: 441 R Axis:   64 Text Interpretation:  Sinus rhythm Baseline wander in lead(s) V5 Sinus rhythm Artifact T wave abnormality Abnormal ekg Confirmed by Gerhard MunchLOCKWOOD, Johney Perotti  MD ([id number]) on 05/30/2013 11:09:26 AM               Gerhard Munchobert Brieann Osinski, MD 05/31/13 1202

## 2013-06-08 ENCOUNTER — Encounter: Payer: Self-pay | Admitting: Physician Assistant

## 2013-06-08 ENCOUNTER — Ambulatory Visit (INDEPENDENT_AMBULATORY_CARE_PROVIDER_SITE_OTHER): Payer: BC Managed Care – PPO | Admitting: Physician Assistant

## 2013-06-08 VITALS — BP 120/88 | HR 98 | Temp 97.4°F | Ht 75.0 in | Wt 226.2 lb

## 2013-06-08 DIAGNOSIS — K219 Gastro-esophageal reflux disease without esophagitis: Secondary | ICD-10-CM

## 2013-06-08 DIAGNOSIS — F988 Other specified behavioral and emotional disorders with onset usually occurring in childhood and adolescence: Secondary | ICD-10-CM

## 2013-06-08 DIAGNOSIS — Z0000 Encounter for general adult medical examination without abnormal findings: Secondary | ICD-10-CM

## 2013-06-08 DIAGNOSIS — Z202 Contact with and (suspected) exposure to infections with a predominantly sexual mode of transmission: Secondary | ICD-10-CM

## 2013-06-08 NOTE — Progress Notes (Signed)
Pre-visit discussion using our clinic review tool. No additional management support is needed unless otherwise documented below in the visit note.  

## 2013-06-08 NOTE — Patient Instructions (Signed)
It was great meeting you today Malik Wade!   Labs have been ordered for you, when you report to lab please be fasting.     Sexually Transmitted Disease A sexually transmitted disease (STD) is a disease or infection that may be passed (transmitted) from person to person, usually during sexual activity. This may happen by way of saliva, semen, blood, vaginal mucus, or urine. Common STDs include:   Gonorrhea.   Chlamydia.   Syphilis.   HIV and AIDS.   Genital herpes.   Hepatitis B and C.   Trichomonas.   Human papillomavirus (HPV).   Pubic lice.   Scabies.  Mites.  Bacterial vaginosis. WHAT ARE CAUSES OF STDs? An STD may be caused by bacteria, a virus, or parasites. STDs are often transmitted during sexual activity if one person is infected. However, they may also be transmitted through nonsexual means. STDs may be transmitted after:   Sexual intercourse with an infected person.   Sharing sex toys with an infected person.   Sharing needles with an infected person or using unclean piercing or tattoo needles.  Having intimate contact with the genitals, mouth, or rectal areas of an infected person.   Exposure to infected fluids during birth. WHAT ARE THE SIGNS AND SYMPTOMS OF STDs? Different STDs have different symptoms. Some people may not have any symptoms. If symptoms are present, they may include:   Painful or bloody urination.   Pain in the pelvis, abdomen, vagina, anus, throat, or eyes.   Skin rash, itching, irritation, growths, sores (lesions), ulcerations, or warts in the genital or anal area.  Abnormal vaginal discharge with or without bad odor.   Penile discharge in men.   Fever.   Pain or bleeding during sexual intercourse.   Swollen glands in the groin area.   Yellow skin and eyes (jaundice). This is seen with hepatitis.   Swollen testicles.  Infertility.  Sores and blisters in the mouth. HOW ARE STDs DIAGNOSED? To make  a diagnosis, your health care provider may:   Take a medical history.   Perform a physical exam.   Take a sample of any discharge for examination.  Swab the throat, cervix, opening to the penis, rectum, or vagina for testing.  Test a sample of your first morning urine.   Perform blood tests.   Perform a Pap smear, if this applies.   Perform a colposcopy.   Perform a laparoscopy.  HOW ARE STDs TREATED? Treatment depends on the STD. Some STDs may be treated but not cured.   Chlamydia, gonorrhea, trichomonas, and syphilis can be cured with antibiotics.   Genital herpes, hepatitis, and HIV can be treated, but not cured, with prescribed medicines. The medicines lessen symptoms.   Genital warts from HPV can be treated with medicine or by freezing, burning (electrocautery), or surgery. Warts may come back.   HPV cannot be cured with medicine or surgery. However, abnormal areas may be removed from the cervix, vagina, or vulva.   If your diagnosis is confirmed, your recent sexual partners need treatment. This is true even if they are symptom-free or have a negative culture or evaluation. They should not have sex until their health care providers say it is OK. HOW CAN I REDUCE MY RISK OF GETTING AN STD?  Use latex condoms, dental dams, and water-soluble lubricants during sexual activity. Do not use petroleum jelly or oils.  Get vaccinated for HPV and hepatitis. If you have not received these vaccines in the past, talk to  your health care provider about whether one or both might be right for you.   Avoid risky sex practices that can break the skin.  WHAT SHOULD I DO IF I THINK I HAVE AN STD?  See your health care provider.   Inform all sexual partners. They should be tested and treated for any STDs.  Do not have sex until your health care provider says it is OK. WHEN SHOULD I GET HELP? Seek immediate medical care if:  You develop severe abdominal pain.  You are  a man and notice swelling or pain in the testicles.  You are a woman and notice swelling or pain in your vagina. Document Released: 06/19/2002 Document Revised: 01/17/2013 Document Reviewed: 10/17/2012 [facility] Patient Information 2014 [place], [place].   Health Maintenance, Males A healthy lifestyle and preventative care can promote health and wellness.  Maintain regular health, dental, and eye exams.  Eat a healthy diet. Foods like vegetables, fruits, whole grains, low-fat dairy products, and lean protein foods contain the nutrients you need and are low in calories. Decrease your intake of foods high in solid fats, added sugars, and salt. Get information about a proper diet from your health care provider, if necessary.  Regular physical exercise is one of the most important things you can do for your health. Most adults should get at least 150 minutes of moderate-intensity exercise (any activity that increases your heart rate and causes you to sweat) each week. In addition, most adults need muscle-strengthening exercises on 2 or more days a week.   Maintain a healthy weight. The body mass index (BMI) is a screening tool to identify possible weight problems. It provides an estimate of body fat based on height and weight. Your health care provider can find your BMI and can help you achieve or maintain a healthy weight. For males 20 years and older:  A BMI below 18.5 is considered underweight.  A BMI of 18.5 to 24.9 is normal.  A BMI of 25 to 29.9 is considered overweight.  A BMI of 30 and above is considered obese.  Maintain normal blood lipids and cholesterol by exercising and minimizing your intake of saturated fat. Eat a balanced diet with plenty of fruits and vegetables. Blood tests for lipids and cholesterol should begin at age 76 and be repeated every 5 years. If your lipid or cholesterol levels are high, you are over 50, or you are at high risk for heart disease, you may need your  cholesterol levels checked more frequently.Ongoing high lipid and cholesterol levels should be treated with medicines, if diet and exercise are not working.  If you smoke, find out from your health care provider how to quit. If you do not use tobacco, do not start.  Lung cancer screening is recommended for adults aged 19 80 years who are at high risk for developing lung cancer because of a history of smoking. A yearly low-dose CT scan of the lungs is recommended for people who have at least a 30-pack-year history of smoking and are a current smoker or have quit within the past 15 years. A pack year of smoking is smoking an average of 1 pack of cigarettes a day for 1 year (for example, a 30-pack-year history of smoking could mean smoking 1 pack a day for 30 years or 2 packs a day for 15 years). Yearly screening should continue until the smoker has stopped smoking for at least 15 years. Yearly screening should be stopped for people who develop  a health problem that would prevent them from having lung cancer treatment.  If you choose to drink alcohol, do not have more than 2 drinks per day. One drink is considered to be 12 oz (360 mL) of beer, 5 oz (150 mL) of wine, or 1.5 oz (45 mL) of liquor.  Avoid use of street drugs. Do not share needles with anyone. Ask for help if you need support or instructions about stopping the use of drugs.  High blood pressure causes heart disease and increases the risk of stroke. Blood pressure should be checked at least every 1 2 years. Ongoing high blood pressure should be treated with medicines if weight loss and exercise are not effective.  If you are 24 30 years old, ask your health care provider if you should take aspirin to prevent heart disease.  Diabetes screening involves taking a blood sample to check your fasting blood sugar level. This should be done once every 3 years after age 3, if you are at a normal weight and without risk factors for diabetes. Testing  should be considered at a younger age or be carried out more frequently if you are overweight and have at least 1 risk factor for diabetes.  Colorectal cancer can be detected and often prevented. Most routine colorectal cancer screening begins at the age of 88 and continues through age 71. However, your health care provider may recommend screening at an earlier age if you have risk factors for colon cancer. On a yearly basis, your health care provider may provide home test kits to check for hidden blood in the stool. A small camera at the end of a tube may be used to directly examine the colon (sigmoidoscopy or colonoscopy) to detect the earliest forms of colorectal cancer. Talk to your health care provider about this at age 35, when routine screening begins. A direct exam of the colon should be repeated every 5 10 years through age 82, unless early forms of pre-cancerous polyps or small growths are found.  People who are at an increased risk for hepatitis B should be screened for this virus. You are considered at high risk for hepatitis B if:  You were born in a country where hepatitis B occurs often. Talk with your health care provider about which countries are considered high-risk.  Your parents were born in a high-risk country and you have not received a shot to protect against hepatitis B (hepatitis B vaccine).  You have HIV or AIDS.  You use needles to inject street drugs.  You live with, or have sex with, someone who has hepatitis B.  You are a man who has sex with other men (MSM).  You get hemodialysis treatment.  You take certain medicines for conditions like cancer, organ transplantation, and autoimmune conditions.  Hepatitis C blood testing is recommended for all people born from [birth year] through 1967 and any individual with known risk factors for hepatitis C.  Healthy men should no longer receive prostate-specific antigen (PSA) blood tests as part of routine cancer screening. Talk to  your health care provider about prostate cancer screening.  Testicular cancer screening is not recommended for adolescents or adult males who have no symptoms. Screening includes self-exam, a health care provider exam, and other screening tests. Consult with your health care provider about any symptoms you have or any concerns you have about testicular cancer.  Practice safe sex. Use condoms and avoid high-risk sexual practices to reduce the spread of sexually transmitted infections (STIs).  Use sunscreen. Apply sunscreen liberally and repeatedly throughout the day. You should seek shade when your shadow is shorter than you. Protect yourself by wearing long sleeves, pants, a wide-brimmed hat, and sunglasses year round, whenever you are outdoors.  Tell your health care provider of new moles or changes in moles, especially if there is a change in shape or color. Also tell your provider if a mole is larger than the size of a pencil eraser.  A one-time screening for abdominal aortic aneurysm (AAA) and surgical repair of large AAAs by ultrasound is recommended for men aged 65 75 years who are current or former smokers.  Stay current with your vaccines (immunizations). Document Released: 09/25/2007 Document Revised: 01/17/2013 Document Reviewed: 08/24/2010 [facility] Patient Information 2014 [place], [place].

## 2013-06-08 NOTE — Progress Notes (Signed)
Patient ID: Malik Wade is a 28 y.o. male DOB: [id number] MRN: [id number]     HPI:  Patient is a 28 year old male who presents to the office to establish care. Owns/works on farm. Reports history of ADD/ADHD controlled with Adderall. States only takes the adderall when he feels he needs. It. Also reports history of intermittent anxiety, was using Lexapro for control however has not used it in a number of months because felt it really was not effective. Both conditions are managed by his psychiatrist. Recent visit to ED for RUQ abdominal pain and chest pain. Evaluated with labs, chest xray, troponin, abdominal ultrasound and ECG all within normal limits. Patient reports frequent episodes of abdominal pain especially when drinking alcohol and eating fatty meals. Symptoms currently resolved. Reports tobacco use when out drinking with friends, otherwise not a smoker. History of heartburn not treated with anything currently, usually only when been out drinking that evening. Reports intermittent chest pain in left chest for last number of years. Describes pain as lasting only seconds, sharp, stabbing type "twinge". Normal findings at ED. Reports has had high blood pressure readings off an on over last year and when arriving at the ED, reports resolved prior to discharge. Also requests STD testing, stating has had unprotected intercourse in the past few months. Denies penile discharge, pain/swelling of testicles, or lesions on penis or in genital area. Denies chest pain currently, denies SOB, cough, change in bowel/bladder habits,  N/V/F/C, visual change/disturbances, lightheaded, dizzy, weak, numbness, pain/difficulty swallowing. Denies thoughts of hurting self or others. Admits to infrequent previous use of controlled substance.     Influenza: 12/14 Tetanus: uncertain   ROS: As stated in HPI. All other systems negative  Past Medical History  Diagnosis Date  . Anxiety   . Panic attack   .  Arthritis   . GERD (gastroesophageal reflux disease)   . Screening for HIV (human immunodeficiency virus)     Negative   Family History  Problem Relation Age of Onset  . Hypertension Mother   . Diabetes Father     Pre-Diabetic    History   Social History  . Marital Status: Single    Spouse Name: N/A    Number of Children: N/A  . Years of Education: Some Colle   Social History Main Topics  . Smoking status: Current Every Day Smoker  . Smokeless tobacco: Never Used  . Alcohol Use: Yes  . Drug Use: Yes    Special: Cocaine  . Sexual Activity: None   Other Topics Concern  . None   Social History Narrative  . None   History reviewed. No pertinent past surgical history. Current Outpatient Prescriptions on File Prior to Visit  Medication Sig Dispense Refill  . amphetamine-dextroamphetamine (ADDERALL) 20 MG tablet Take 20 mg by mouth daily.      . diphenhydramine-acetaminophen (TYLENOL PM) 25-500 MG TABS Take 2 tablets by mouth at bedtime as needed.      Marland Kitchen. ibuprofen (ADVIL,MOTRIN) 200 MG tablet Take 600 mg by mouth every 6 (six) hours as needed for mild pain.       No current facility-administered medications on file prior to visit.   No Known Allergies  PE: CONSTITUTIONAL: Well developed, well nourished, pleasant, appears stated age, stuttering on occasion.  HEENT: normocephalic, atraumatic, bilateral ext/int canals normal. Bilateral TM's without injections, bulging, erythema. Nose normal, uvula midline, oropharynx clear and moist. EYES: PERRLA, bilateral EOM and conjunctiva normal, no icterus NECK: FROM, supple, without  thyromegaly or mass CARDIO: RRR, normal S1 and S2, no m/r/g, distal pulses intact. PULM/CHEST CTA bilateral, no wheezes, rales or rhonchi. Non tender. ABD: appearance normal, soft, nontender. Normal bowel sounds x 4 quadrants, non palpable spleen, kidney or liver. No rebound tenderness, guarding or CVA tenderness. GU: deferred.  MUSC: FROM U/LE bilateral,  FROM of thoracic and lumbar spine LYMPH: no cervical, supraclavicular adenopathy NEURO: alert and oriented x 3, no cranial nerve deficit, motor strength and coordination NL. Negative romberg. Gait normal. DTR's intact. SKIN: warm, dry, no rash or lesions noted. PSYCH: Mood and affect normal, speech normal with intermittent stutter.    Lab Results  Component Value Date   WBC 8.0 05/30/2013   HGB 15.7 05/30/2013   HCT 43.0 05/30/2013   PLT 200 05/30/2013   GLUCOSE 109* 05/30/2013   ALT 20 05/30/2013   AST 18 05/30/2013   NA 140 05/30/2013   K 3.6* 05/30/2013   CL 101 05/30/2013   CREATININE 0.93 05/30/2013   BUN 15 05/30/2013   CO2 23 05/30/2013   Reviewed notes from recent ED visit dated 05/30/13. No significant findings.   ASSESSMENT and PLAN   CPX/v70.0 - Patient has been counseled on age-appropriate routine health concerns for screening and prevention. These are reviewed and up-to-date. Immunizations are up-to-date or declined. Labs ordered and will be reviewed.  ADD: Continue use of medications as prescribed by psychiatrist.  Anxiety: Counseled to discuss with psychiatrist regarding medication options.   GERD: Counseled patient on preventive measures to include a decrease in alcohol, smoking, greasy/spicy/fatty foods, eliminating eating just prior to bedtime, elevate head of bed.  Patient will consider use of OTC Prilosec  STI: Counseled on importance of condom use. Labs today for HIV and GC/chlamydia

## 2013-06-11 ENCOUNTER — Ambulatory Visit (INDEPENDENT_AMBULATORY_CARE_PROVIDER_SITE_OTHER): Payer: BC Managed Care – PPO

## 2013-06-11 DIAGNOSIS — Z0000 Encounter for general adult medical examination without abnormal findings: Secondary | ICD-10-CM

## 2013-06-11 DIAGNOSIS — Z202 Contact with and (suspected) exposure to infections with a predominantly sexual mode of transmission: Secondary | ICD-10-CM

## 2013-06-11 LAB — URINALYSIS, ROUTINE W REFLEX MICROSCOPIC
HGB URINE DIPSTICK: NEGATIVE
Ketones, ur: NEGATIVE
Leukocytes, UA: NEGATIVE
NITRITE: NEGATIVE
PH: 5.5 (ref 5.0–8.0)
RBC / HPF: NONE SEEN (ref 0–?)
TOTAL PROTEIN, URINE-UPE24: NEGATIVE
Urine Glucose: NEGATIVE
Urobilinogen, UA: 0.2 (ref 0.0–1.0)

## 2013-06-11 LAB — BASIC METABOLIC PANEL
BUN: 14 mg/dL (ref 6–23)
CALCIUM: 9.3 mg/dL (ref 8.4–10.5)
CO2: 27 meq/L (ref 19–32)
Chloride: 105 mEq/L (ref 96–112)
Creatinine, Ser: 0.9 mg/dL (ref 0.4–1.5)
GFR: 104.11 mL/min (ref >60.00)
Glucose, Bld: 92 mg/dL (ref 70–99)
Potassium: 4.1 mEq/L (ref 3.5–5.1)
SODIUM: 139 meq/L (ref 135–145)

## 2013-06-11 LAB — HEPATIC FUNCTION PANEL
ALT: 37 U/L (ref 0–53)
AST: 29 U/L (ref 0–37)
Albumin: 4.4 g/dL (ref 3.5–5.2)
Alkaline Phosphatase: 43 U/L (ref 39–117)
BILIRUBIN DIRECT: 0.1 mg/dL (ref 0.0–0.3)
BILIRUBIN TOTAL: 0.9 mg/dL (ref 0.3–1.2)
Total Protein: 7.1 g/dL (ref 6.0–8.3)

## 2013-06-11 LAB — CBC WITH DIFFERENTIAL/PLATELET
BASOS PCT: 0.6 % (ref 0.0–3.0)
Basophils Absolute: 0 10*3/uL (ref 0.0–0.1)
Eosinophils Absolute: 0.1 10*3/uL (ref 0.0–0.7)
Eosinophils Relative: 1.5 % (ref 0.0–5.0)
HCT: 43.9 % (ref 39.0–52.0)
Hemoglobin: 14.9 g/dL (ref 13.0–17.0)
LYMPHS PCT: 43.9 % (ref 12.0–46.0)
Lymphs Abs: 2.3 10*3/uL (ref 0.7–4.0)
MCHC: 33.9 g/dL (ref 30.0–36.0)
MCV: 89.6 fl (ref 78.0–100.0)
MONO ABS: 0.5 10*3/uL (ref 0.1–1.0)
MONOS PCT: 9.5 % (ref 3.0–12.0)
NEUTROS PCT: 44.5 % (ref 43.0–77.0)
Neutro Abs: 2.4 10*3/uL (ref 1.4–7.7)
PLATELETS: 184 10*3/uL (ref 150.0–400.0)
RBC: 4.9 Mil/uL (ref 4.22–5.81)
RDW: 12.5 % (ref 11.5–14.6)
WBC: 5.3 10*3/uL (ref 4.5–10.5)

## 2013-06-11 LAB — LIPID PANEL
Cholesterol: 142 mg/dL (ref 0–200)
HDL: 44.1 mg/dL (ref >39.00)
LDL Cholesterol: 82 mg/dL (ref 0–99)
Total CHOL/HDL Ratio: 3
Triglycerides: 78 mg/dL (ref 0.0–149.0)
VLDL: 15.6 mg/dL (ref 0.0–40.0)

## 2013-06-11 LAB — TSH: TSH: 1.95 u[IU]/mL (ref 0.35–5.50)

## 2013-06-12 LAB — HIV ANTIBODY (ROUTINE TESTING W REFLEX): HIV: NONREACTIVE

## 2013-06-12 LAB — GC/CHLAMYDIA PROBE AMP
CT Probe RNA: NEGATIVE
GC PROBE AMP APTIMA: NEGATIVE

## 2014-01-16 ENCOUNTER — Ambulatory Visit (INDEPENDENT_AMBULATORY_CARE_PROVIDER_SITE_OTHER): Payer: BC Managed Care – PPO | Admitting: Emergency Medicine

## 2014-01-16 VITALS — BP 122/74 | HR 89 | Temp 98.8°F | Resp 17 | Ht 74.5 in | Wt 215.0 lb

## 2014-01-16 DIAGNOSIS — I781 Nevus, non-neoplastic: Secondary | ICD-10-CM

## 2014-01-16 DIAGNOSIS — Z23 Encounter for immunization: Secondary | ICD-10-CM

## 2014-01-16 NOTE — Progress Notes (Signed)
[facility] [address], [place] [id number] [phone]  Date:  01/16/2014   Name:  Malik RelicWilliam H Manocchio   DOB:  May 18, 1985   MRN:  [id number]  PCP:  Baltazar ApoHartman, Nancy, PA-C    Chief Complaint: Groin Injury and Immunizations   History of Present Illness:  Malik RelicWilliam H Ridener is a 28 y.o. very pleasant male patient who presents with the following:  Has two moles on his penis that he wanted checked.  Stable and not enlarging. Not symptomatic.  No GU symptoms. Requesting a flu shot.. No improvement with over the counter medications or other home remedies.  Denies other complaint or health concern today.   There are no active problems to display for this patient.   Past Medical History  Diagnosis Date  . Anxiety   . Panic attack   . Arthritis   . GERD (gastroesophageal reflux disease)   . Screening for HIV (human immunodeficiency virus)     Negative    Past Surgical History  Procedure Laterality Date  . Eye surgery      History  Substance Use Topics  . Smoking status: Former Smoker    Quit date: 11/16/2013  . Smokeless tobacco: Never Used  . Alcohol Use: Yes    Family History  Problem Relation Age of Onset  . Hypertension Mother   . Diabetes Father     Pre-Diabetic   . Diabetes Sister     No Known Allergies  Medication list has been reviewed and updated.  Current Outpatient Prescriptions on File Prior to Visit  Medication Sig Dispense Refill  . amphetamine-dextroamphetamine (ADDERALL) 20 MG tablet Take 20 mg by mouth daily.       No current facility-administered medications on file prior to visit.    Review of Systems:  As per HPI, otherwise negative.    Physical Examination: Filed Vitals:   01/16/14 1356  BP: 122/74  Pulse: 89  Temp: 98.8 F (37.1 C)  Resp: 17   Filed Vitals:   01/16/14 1356  Height: 6' 2.5" (1.892 m)  Weight: 215 lb (97.523 kg)   Body mass index is 27.24 kg/(m^2). Ideal Body Weight: Weight in (lb) to  have BMI = 25: 196.9    GEN: WDWN, NAD, Non-toxic, Alert & Oriented x 3 HEENT: Atraumatic, Normocephalic.  Ears and Nose: No external deformity. EXTR: No clubbing/cyanosis/edema NEURO: Normal gait.  PSYCH: Normally interactive. Conversant. Not depressed or anxious appearing.  Calm demeanor.  GENITALIA:  1.5 mm mole smooth border uniform color on mid shaft.  1 mm mole on distal shaft.  Assessment and Plan: Benign nevi Flu shot  Signed,  Phillips OdorJeffery Rik Wadel, MD

## 2014-01-16 NOTE — Patient Instructions (Signed)
Moles Moles are usually harmless growths on the skin. They are accumulations of color (pigment) cells in the skin that:   Can be various colors, from light brown to black.  Can appear anywhere on the body.  May remain flat or become raised.  May contain hairs.  May remain smooth or develop wrinkling. Most moles are not cancerous (benign). However, some moles may develop changes and become cancerous. It is important to check your moles every month. If you check your moles regularly, you will be able to notice any changes that may occur.  CAUSES  Moles occur when skin cells grow together in clusters instead of spreading out in the skin as they normally do. The reason for this clustering is unknown. DIAGNOSIS  Your caregiver will perform a skin examination to diagnose your mole.  TREATMENT  Moles usually do not require treatment. If a mole becomes worrisome, your caregiver may choose to take a sample of the mole or remove it entirely, and then send it to a lab for examination.  HOME CARE INSTRUCTIONS  Check your mole(s) monthly for changes that may indicate skin cancer. These changes can include:  A change in size.  A change in color. Note that moles tend to darken during pregnancy or when taking birth control pills (oral contraception).  A change in shape.  A change in the border of the mole.  Wear sunscreen (with an SPF of at least 30) when you spend long periods of time outside. Reapply the sunscreen every 2-3 hours.  Schedule annual appointments with your skin doctor (dermatologist) if you have a large number of moles. SEEK MEDICAL CARE IF:  Your mole changes size, especially if it becomes larger than a pencil eraser.  Your mole changes in color or develops more than one color.  Your mole becomes itchy or bleeds.  Your mole, or the skin near the mole, becomes painful, sore, red, or swollen.  Your mole becomes scaly, sheds skin, or oozes fluid.  Your mole develops  irregular borders.  Your mole becomes flat or develops raised areas.  Your mole becomes hard or soft. Document Released: 12/22/2000 Document Revised: 12/22/2011 Document Reviewed: 10/11/2011 [employer] Patient Information 2015 [employer]. This information is not intended to replace advice given to you by your health care provider. Make sure you discuss any questions you have with your health care provider.  

## 2014-01-17 DIAGNOSIS — Z23 Encounter for immunization: Secondary | ICD-10-CM

## 2015-08-13 IMAGING — US US ABDOMEN COMPLETE
1 series · 14 of 25 positions shown · non-contrast
Comparison: None.

CLINICAL DATA: Right upper quadrant pain.

EXAM:
ULTRASOUND ABDOMEN COMPLETE

[Series 1: us abdomen complete · 0.32mm/px · 14 of 73 slices shown]
[im 1/73]
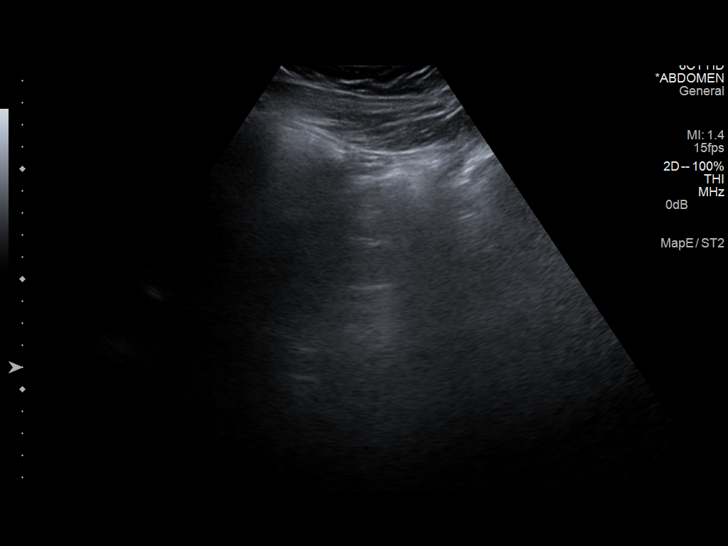
[im 7/73]
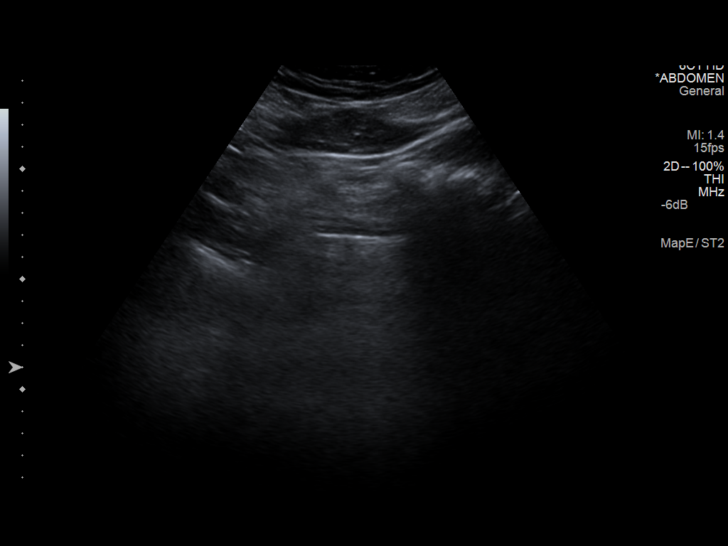
[im 13/73]
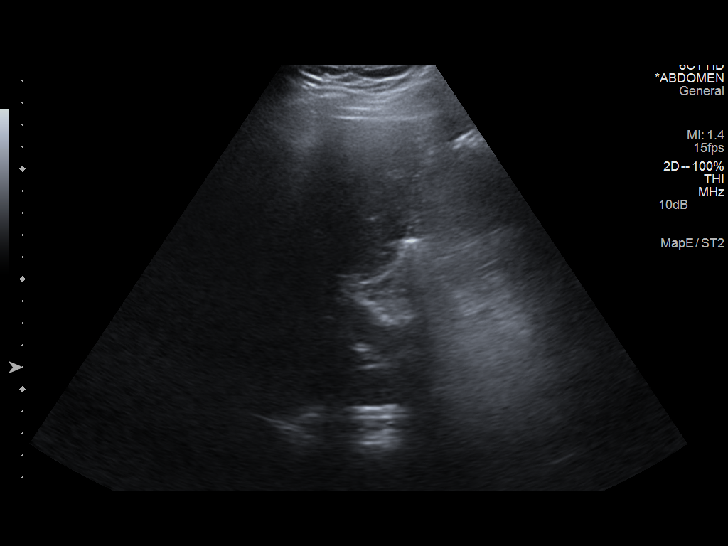
[im 19/73]
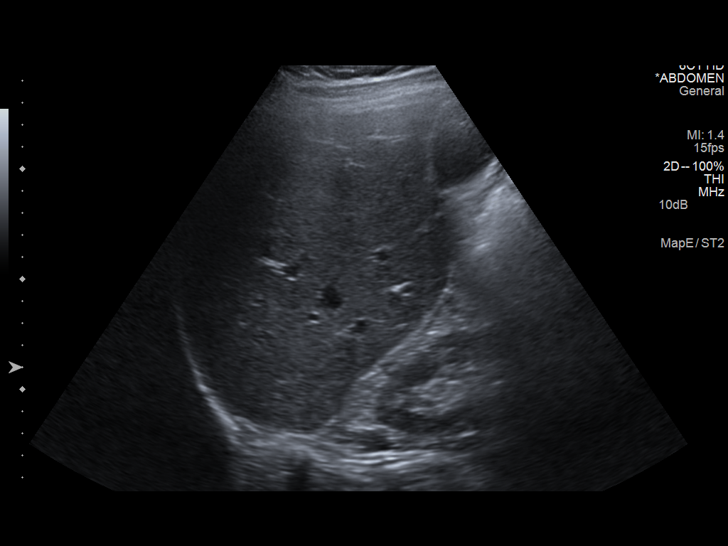
[im 25/73]
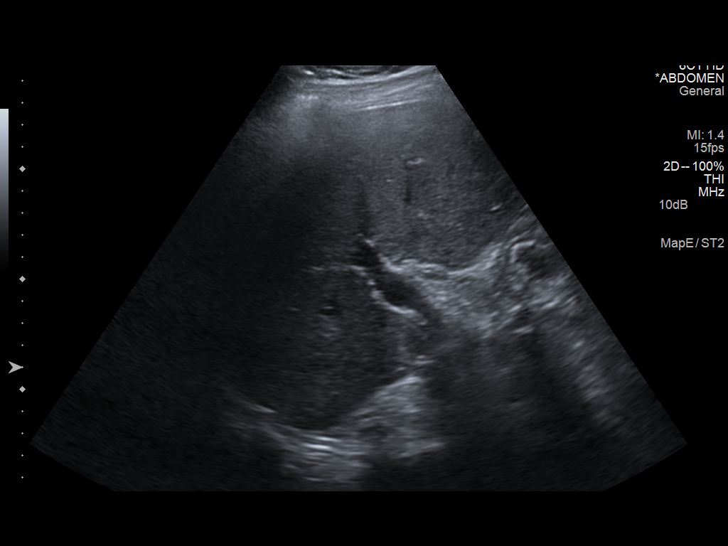
[im 28/73]
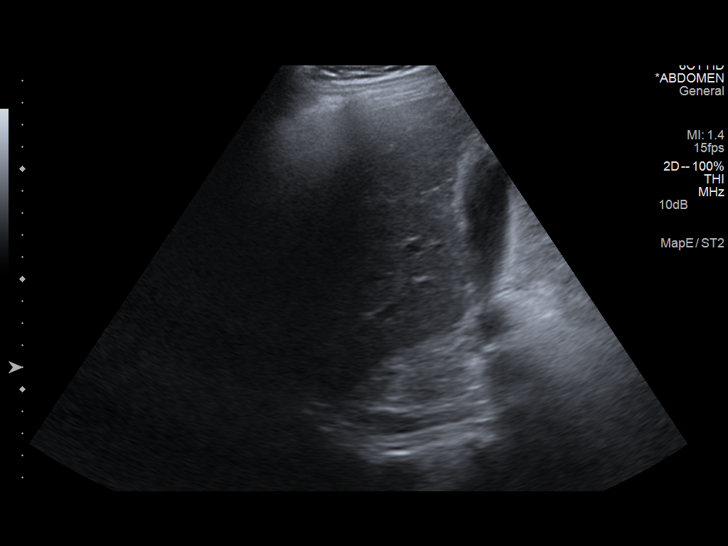
[im 34/73]
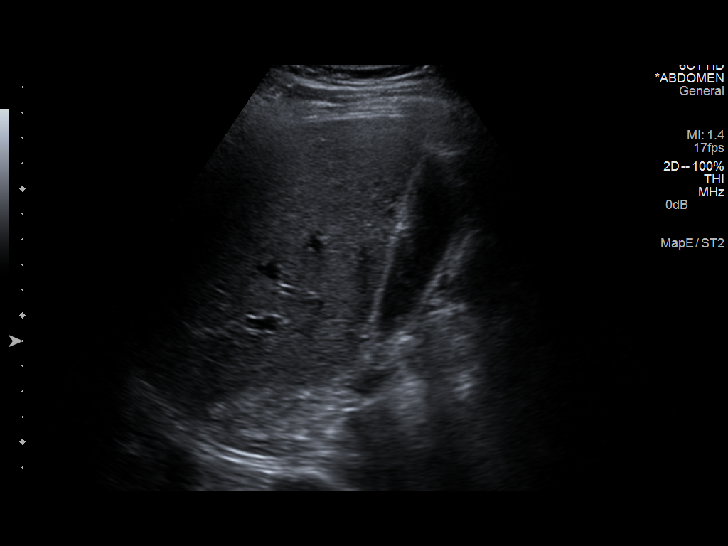
[im 40/73]
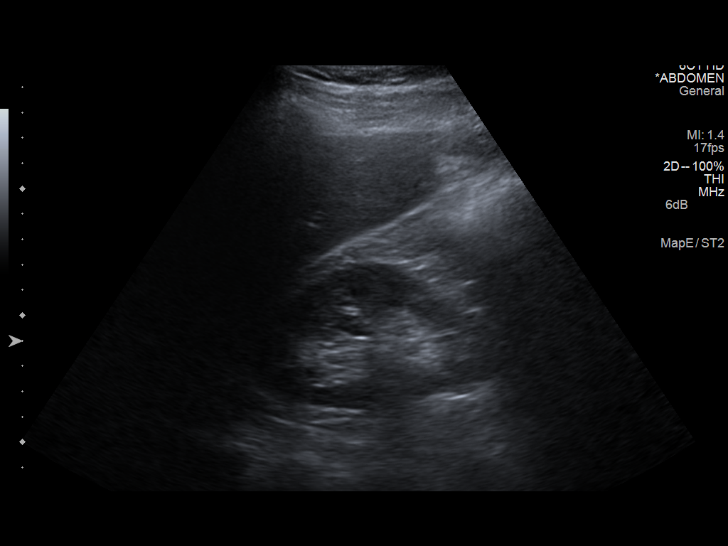
[im 46/73]
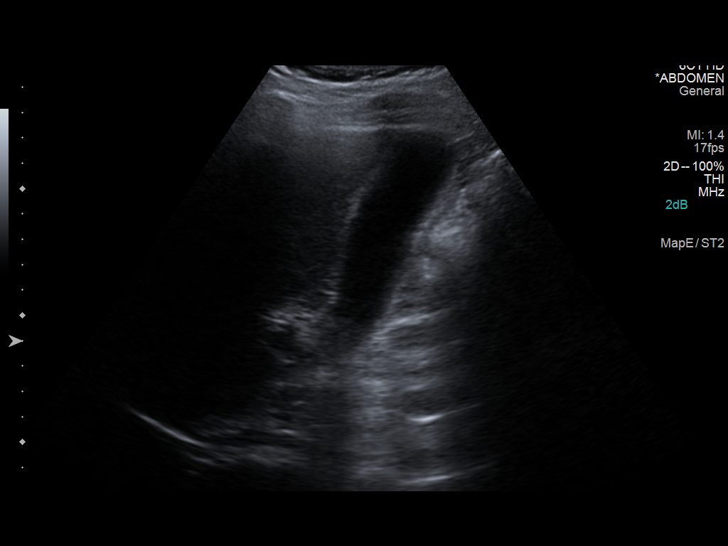
[im 49/73]
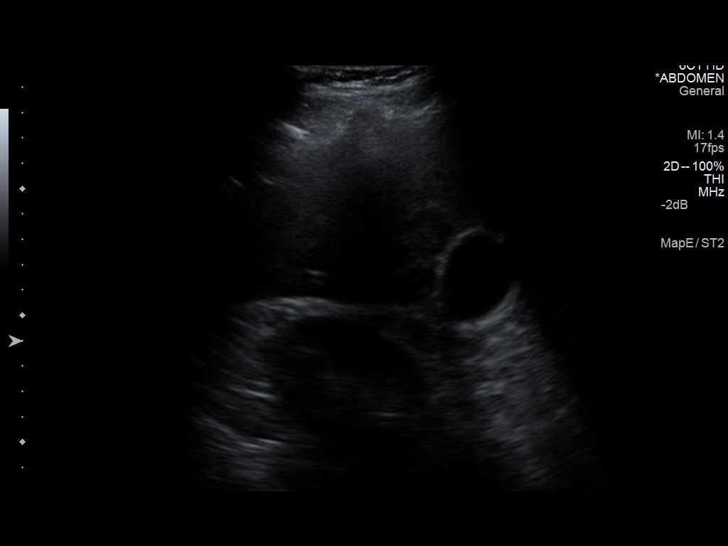
[im 55/73]
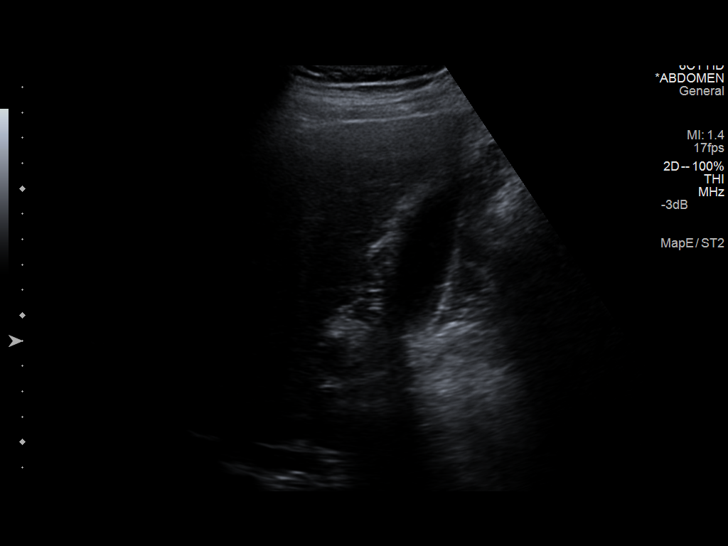
[im 61/73]
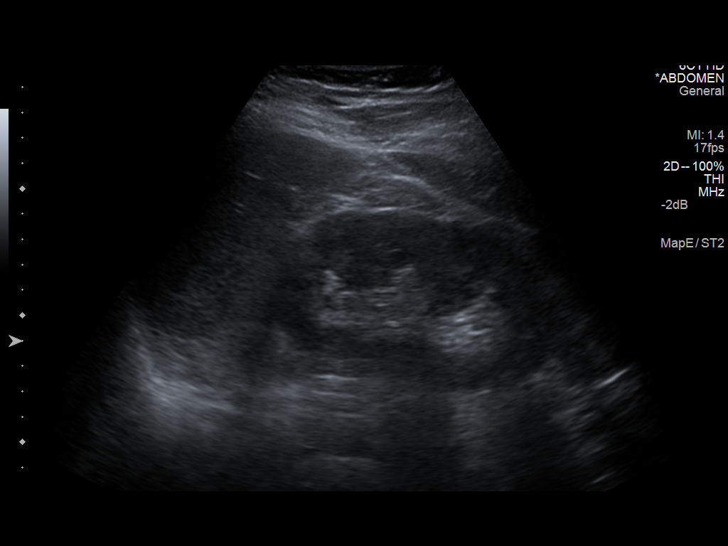
[im 67/73]
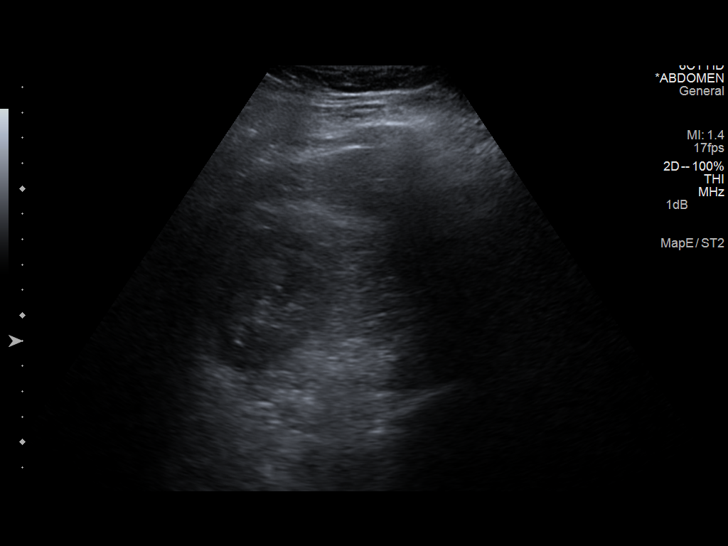
[im 73/73]
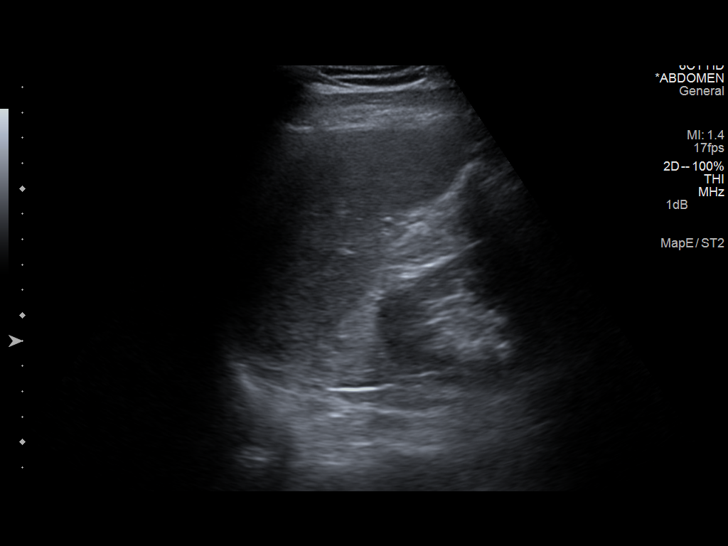

[14 of 25 positions shown; findings below may reference images not displayed]

FINDINGS: Gallbladder:

No gallstones or wall thickening visualized. No sonographic Murphy
sign noted.

Common bile duct:

Diameter: 5 mm.  No evidence of filling defect.

Liver:

No focal lesion identified. Within normal limits in parenchymal
echogenicity. Antegrade flow in the imaged portal venous system.

IVC:

No abnormality visualized.

Pancreas:

Limited visualization due to tight sonographic windows

Spleen:

Size and appearance within normal limits.

Right Kidney:

Length: 11 cm. Echogenicity within normal limits. No mass or
hydronephrosis visualized.

Left Kidney:

Length: 12 cm. Echogenicity within normal limits. No mass or
hydronephrosis visualized.

Abdominal aorta:

Only the proximal aorta is visible, nondilated at 1.8 cm.
IMPRESSION: Negative abdominal ultrasound.
